# Patient Record
Sex: Female | Born: 1995 | Race: Black or African American | Hispanic: No | Marital: Single | Smoking: Never smoker
Health system: Southern US, Community
[De-identification: ages and names within clinical notes are randomized; demographics above are authoritative.]

## PROBLEM LIST (undated history)

## (undated) DIAGNOSIS — G809 Cerebral palsy, unspecified: Secondary | ICD-10-CM

---
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
## 2016-07-19 ENCOUNTER — Emergency Department (HOSPITAL_COMMUNITY): Payer: Self-pay

## 2016-07-19 ENCOUNTER — Emergency Department (HOSPITAL_COMMUNITY)
Admission: EM | Admit: 2016-07-19 | Discharge: 2016-07-19 | Disposition: A | Discharge status: Home / Self Care | Payer: Self-pay | Attending: Emergency Medicine | Admitting: Emergency Medicine

## 2016-07-19 ENCOUNTER — Encounter (HOSPITAL_COMMUNITY): Payer: Self-pay

## 2016-07-19 DIAGNOSIS — S81011A Laceration without foreign body, right knee, initial encounter: Secondary | ICD-10-CM | POA: Insufficient documentation

## 2016-07-19 DIAGNOSIS — M545 Low back pain: Secondary | ICD-10-CM | POA: Insufficient documentation

## 2016-07-19 DIAGNOSIS — R1032 Left lower quadrant pain: Secondary | ICD-10-CM | POA: Insufficient documentation

## 2016-07-19 DIAGNOSIS — M5489 Other dorsalgia: Secondary | ICD-10-CM

## 2016-07-19 DIAGNOSIS — M546 Pain in thoracic spine: Secondary | ICD-10-CM | POA: Insufficient documentation

## 2016-07-19 DIAGNOSIS — M25561 Pain in right knee: Secondary | ICD-10-CM

## 2016-07-19 DIAGNOSIS — R079 Chest pain, unspecified: Secondary | ICD-10-CM | POA: Insufficient documentation

## 2016-07-19 DIAGNOSIS — Y939 Activity, unspecified: Secondary | ICD-10-CM | POA: Insufficient documentation

## 2016-07-19 DIAGNOSIS — G9389 Other specified disorders of brain: Secondary | ICD-10-CM | POA: Insufficient documentation

## 2016-07-19 DIAGNOSIS — Z23 Encounter for immunization: Secondary | ICD-10-CM | POA: Insufficient documentation

## 2016-07-19 DIAGNOSIS — Y999 Unspecified external cause status: Secondary | ICD-10-CM | POA: Insufficient documentation

## 2016-07-19 DIAGNOSIS — Y92410 Unspecified street and highway as the place of occurrence of the external cause: Secondary | ICD-10-CM | POA: Insufficient documentation

## 2016-07-19 HISTORY — DX: Cerebral palsy, unspecified: G80.9

## 2016-07-19 LAB — PREGNANCY, URINE: Preg Test, Ur: NEGATIVE

## 2016-07-19 LAB — I-STAT CREATININE, ED: Creatinine, Ser: 0.7 mg/dL (ref 0.44–1.00)

## 2016-07-19 MED ORDER — IOPAMIDOL (ISOVUE-300) INJECTION 61%
INTRAVENOUS | Status: AC
  Administered 2016-07-19: 100 mL
  Filled 2016-07-19: qty 100

## 2016-07-19 MED ORDER — IBUPROFEN 800 MG PO TABS: 800.0000 mg | ORAL_TABLET | Freq: Three times a day (TID) | ORAL | 0 refills | Status: AC

## 2016-07-19 MED ORDER — TETANUS-DIPHTH-ACELL PERTUSSIS 5-2.5-18.5 LF-MCG/0.5 IM SUSP
0.5000 mL | Freq: Once | INTRAMUSCULAR | Status: AC
  Administered 2016-07-19: 0.5 mL via INTRAMUSCULAR
  Filled 2016-07-19: qty 0.5

## 2016-07-19 MED ORDER — LIDOCAINE HCL (PF) 1 % IJ SOLN
10.0000 mL | Freq: Once | INTRAMUSCULAR | Status: AC
  Administered 2016-07-19: 10 mL
  Filled 2016-07-19: qty 10

## 2016-07-19 MED ORDER — IBUPROFEN 400 MG PO TABS
800.0000 mg | ORAL_TABLET | Freq: Once | ORAL | Status: AC
  Administered 2016-07-19: 800 mg via ORAL
  Filled 2016-07-19: qty 2

## 2016-07-19 NOTE — ED Notes (Signed)
Updated pt's grandmother over phone per pt's request.
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
## 2016-07-19 NOTE — ED Notes (Addendum)
Bacitracin applied to patient wounds on right knee. Patient and family given a cup of ice and sprite.
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
## 2017-08-21 ENCOUNTER — Ambulatory Visit (HOSPITAL_COMMUNITY)
Admission: EM | Admit: 2017-08-21 | Discharge: 2017-08-21 | Disposition: A | Discharge status: Home / Self Care | Payer: Self-pay | Attending: Family Medicine | Admitting: Family Medicine

## 2017-08-21 ENCOUNTER — Encounter (HOSPITAL_COMMUNITY): Payer: Self-pay | Admitting: *Deleted

## 2017-08-21 DIAGNOSIS — R05 Cough: Secondary | ICD-10-CM

## 2017-08-21 DIAGNOSIS — R059 Cough, unspecified: Secondary | ICD-10-CM

## 2017-08-21 MED ORDER — AZITHROMYCIN 250 MG PO TABS: 250.0000 mg | ORAL_TABLET | Freq: Every day | ORAL | 0 refills | Status: DC

## 2017-08-21 MED ORDER — HYDROCODONE-HOMATROPINE 5-1.5 MG/5ML PO SYRP: 5.0000 mL | ORAL_SOLUTION | Freq: Four times a day (QID) | ORAL | 0 refills | Status: DC | PRN

## 2017-08-21 NOTE — ED Triage Notes (Signed)
Pt  Reports   Coughing   And   Stuff  Nose    Since  Last  Pm     Family  Members   Ill  As   Well

## 2017-08-22 NOTE — ED Provider Notes (Signed)
  [facility]   [id number] 08/21/17 Arrival Time: 1419  ASSESSMENT & PLAN:  1. Cough     Meds ordered this encounter  Medications  . HYDROcodone-homatropine (HYCODAN) 5-1.5 MG/5ML syrup    Sig: Take 5 mLs by mouth every 6 (six) hours as needed for cough.    Dispense:  90 mL    Refill:  0  . azithromycin (ZITHROMAX) 250 MG tablet    Sig: Take 1 tablet (250 mg total) by mouth daily. Take first 2 tablets together, then 1 every day until finished.    Dispense:  6 tablet    Refill:  0   OTC analgesics and symptom care as needed. [place] Controlled Substances Registry consulted for this patient. I feel the risk/benefit ratio today is favorable for proceeding with this prescription for a controlled substance. Medication sedation precautions. Will f/u with PCP if not improving. Reviewed expectations re: course of current medical issues. Questions answered. Outlined signs and symptoms indicating need for more acute intervention. Patient verbalized understanding. After Visit Summary given.   SUBJECTIVE:  Ann Nichols is a 21 y.o. female who presents with complaint of nasal congestion, post-nasal drainage, and a persistent cough for the past 2 weeks. Cough is bothering her the most and is now affecting her sleep. No SOB or wheezing. Cough non-productive. Afebrile. Overall fatigued. OTC without relief.  ROS: As per HPI.   OBJECTIVE:  Vitals:   08/21/17 1551  BP: (!) 138/95  Pulse: 90  Resp: 18  Temp: 98.8 F (37.1 C)  TempSrc: Oral  SpO2: 98%     General appearance: alert; no distress HEENT: nasal congestion; clear runny nose; throat irritation secondary to post-nasal drainage Neck: supple without LAD Lungs: clear to auscultation bilaterally; persistent dry coughing Skin: warm and dry Psychological:  alert and cooperative; normal mood and affect  No Known Allergies  Past Medical History:  Diagnosis Date  . CP (cerebral palsy) (HCC)              Mardella LaymanHagler, Nannette Zill, MD 08/22/17 1113

## 2018-02-06 ENCOUNTER — Encounter (HOSPITAL_COMMUNITY): Payer: Self-pay | Admitting: Family Medicine

## 2018-02-06 ENCOUNTER — Ambulatory Visit (HOSPITAL_COMMUNITY)
Admission: EM | Admit: 2018-02-06 | Discharge: 2018-02-06 | Disposition: A | Discharge status: Home / Self Care | Payer: Medicaid - Out of State | Attending: Family Medicine | Admitting: Family Medicine

## 2018-02-06 DIAGNOSIS — H66001 Acute suppurative otitis media without spontaneous rupture of ear drum, right ear: Secondary | ICD-10-CM | POA: Diagnosis not present

## 2018-02-06 MED ORDER — AMOXICILLIN-POT CLAVULANATE 875-125 MG PO TABS: 1.0000 | ORAL_TABLET | Freq: Two times a day (BID) | ORAL | 0 refills | Status: AC

## 2018-02-06 MED ORDER — IPRATROPIUM BROMIDE 0.06 % NA SOLN: 2.0000 | Freq: Four times a day (QID) | NASAL | 0 refills | Status: AC

## 2018-02-06 MED ORDER — CETIRIZINE HCL 10 MG PO TABS: 10.0000 mg | ORAL_TABLET | Freq: Every day | ORAL | 0 refills | Status: AC

## 2018-02-06 MED ORDER — FLUTICASONE PROPIONATE 50 MCG/ACT NA SUSP: 2.0000 | Freq: Every day | NASAL | 0 refills | Status: AC

## 2018-02-06 NOTE — ED Triage Notes (Signed)
Pt here for cough and congestion, URI symptoms x a week.

## 2018-02-06 NOTE — Discharge Instructions (Signed)
Start Augmentin for ear infection, this will also help with any sinus infection that is building up. Start flonase, atrovent nasal spray, zyrtec for nasal congestion/drainage. You can use over the counter nasal saline rinse such as neti pot for nasal congestion. Keep hydrated, your urine should be clear to pale yellow in color. Tylenol/motrin for fever and pain. Monitor for any worsening of symptoms, chest pain, shortness of breath, wheezing, swelling of the throat, follow up for reevaluation.   For sore throat try using a honey-based tea. Use 3 teaspoons of honey with juice squeezed from half lemon. Place shaved pieces of ginger into 1/2-1 cup of water and warm over stove top. Then mix the ingredients and repeat every 4 hours as needed.

## 2018-02-06 NOTE — ED Provider Notes (Signed)
[facility]    CSN: [id number] Arrival date & time: 02/06/18  1418     History   Chief Complaint Chief Complaint  Patient presents with  . Cough    HPI Ann Nichols is a 22 y.o. female.   22 year old female comes in for 1 week history of URI symptoms. Productive cough, rhinorrhea, nasal congestion, sore throat, ear pain. States sister felt that she has a fever, but unknown temperature. Eating and drinking without problems. Has not taken anything for her symptoms. Never smoker.        Past Medical History:  Diagnosis Date  . CP (cerebral palsy) (HCC)     There are no active problems to display for this patient.   History reviewed. No pertinent surgical history.  OB History    No data available       Home Medications    Prior to Admission medications   Medication Sig Start Date End Date Taking? Authorizing Provider  amoxicillin-clavulanate (AUGMENTIN) 875-125 MG tablet Take 1 tablet by mouth every 12 (twelve) hours. 02/06/18   Cathie HoopsYu, Daniell Paradise V, PA-C  cetirizine (ZYRTEC) 10 MG tablet Take 1 tablet (10 mg total) by mouth daily. 02/06/18   Cathie HoopsYu, Davine Sweney V, PA-C  fluticasone (FLONASE) 50 MCG/ACT nasal spray Place 2 sprays into both nostrils daily. 02/06/18   Cathie HoopsYu, Rithik Odea V, PA-C  ibuprofen (ADVIL,MOTRIN) 800 MG tablet Take 1 tablet (800 mg total) by mouth 3 (three) times daily. 07/19/16   Law, Waylan BogaAlexandra M, PA-C  ipratropium (ATROVENT) 0.06 % nasal spray Place 2 sprays into both nostrils 4 (four) times daily. 02/06/18   Belinda FisherYu, Pam Vanalstine V, PA-C    Family History History reviewed. No pertinent family history.  Social History Social History   Tobacco Use  . Smoking status: Never Smoker  . Smokeless tobacco: Never Used  Substance Use Topics  . Alcohol use: No  . Drug use: Not on file     Allergies   Patient has no known allergies.   Review of Systems Review of Systems  Reason unable to perform ROS: See HPI as above.     Physical Exam Triage Vital Signs ED Triage  Vitals [02/06/18 1502]  Enc Vitals Group     BP 134/87     Pulse Rate (!) 107     Resp 18     Temp 99.2 F (37.3 C)     Temp src      SpO2 99 %     Weight      Height      Head Circumference      Peak Flow      Pain Score      Pain Loc      Pain Edu?      Excl. in GC?    No data found.  Updated Vital Signs BP 134/87   Pulse (!) 107   Temp 99.2 F (37.3 C)   Resp 18   SpO2 99%   Physical Exam  Constitutional: She is oriented to person, place, and time. She appears well-developed and well-nourished. No distress.  HENT:  Head: Normocephalic and atraumatic.  Right Ear: External ear and ear canal normal. Tympanic membrane is erythematous and bulging.  Left Ear: External ear and ear canal normal. Tympanic membrane is erythematous. Tympanic membrane is not bulging.  Nose: Mucosal edema and rhinorrhea present. Right sinus exhibits no maxillary sinus tenderness and no frontal sinus tenderness. Left sinus exhibits no maxillary sinus tenderness and no frontal sinus tenderness.  Mouth/Throat: Uvula is midline and mucous membranes are normal. Posterior oropharyngeal erythema present. No tonsillar exudate.  Eyes: Conjunctivae are normal. Pupils are equal, round, and reactive to light.  Neck: Normal range of motion. Neck supple.  Cardiovascular: Normal rate, regular rhythm and normal heart sounds. Exam reveals no gallop and no friction rub.  No murmur heard. Pulmonary/Chest: Effort normal and breath sounds normal. She has no decreased breath sounds. She has no wheezes. She has no rhonchi. She has no rales.  Lymphadenopathy:    She has no cervical adenopathy.  Neurological: She is alert and oriented to person, place, and time.  Skin: Skin is warm and dry.  Psychiatric: She has a normal mood and affect. Her behavior is normal. Judgment normal.    UC Treatments / Results  Labs (all labs ordered are listed, but only abnormal results are displayed) Labs Reviewed - No data to  display  EKG  EKG Interpretation None       Radiology No results found.  Procedures Procedures (including critical care time)  Medications Ordered in UC Medications - No data to display   Initial Impression / Assessment and Plan / UC Course  I have reviewed the triage vital signs and the nursing notes.  Pertinent labs & imaging results that were available during my care of the patient were reviewed by me and considered in my medical decision making (see chart for details).    Given 1 week history of symptoms, will treat with Augmentin for otitis media and cover for sinusitis.  Other symptomatic treatment discussed.  Push fluids.  Return precautions given.  Patient expresses understanding and agrees to plan.  Final Clinical Impressions(s) / UC Diagnoses   Final diagnoses:  Acute suppurative otitis media of right ear without spontaneous rupture of tympanic membrane, recurrence not specified    ED Discharge Orders        Ordered    amoxicillin-clavulanate (AUGMENTIN) 875-125 MG tablet  Every 12 hours     02/06/18 1610    fluticasone (FLONASE) 50 MCG/ACT nasal spray  Daily     02/06/18 1610    ipratropium (ATROVENT) 0.06 % nasal spray  4 times daily     02/06/18 1610    cetirizine (ZYRTEC) 10 MG tablet  Daily     02/06/18 1610        Belinda Fisher, [place] 02/06/18 1612
